# Patient Record
Sex: Male | Born: 1990 | Race: Black or African American | Hispanic: No | Marital: Single | Smoking: Former smoker
Health system: Southern US, Community
[De-identification: ages and names within clinical notes are randomized; demographics above are authoritative.]

---

## 2021-04-05 ENCOUNTER — Emergency Department (HOSPITAL_BASED_OUTPATIENT_CLINIC_OR_DEPARTMENT_OTHER)
Admission: EM | Admit: 2021-04-05 | Discharge: 2021-04-05 | Disposition: A | Discharge status: Home / Self Care | Payer: No Typology Code available for payment source | Attending: Emergency Medicine | Admitting: Emergency Medicine

## 2021-04-05 ENCOUNTER — Other Ambulatory Visit: Payer: Self-pay

## 2021-04-05 ENCOUNTER — Other Ambulatory Visit (HOSPITAL_BASED_OUTPATIENT_CLINIC_OR_DEPARTMENT_OTHER): Payer: Self-pay

## 2021-04-05 ENCOUNTER — Emergency Department (HOSPITAL_BASED_OUTPATIENT_CLINIC_OR_DEPARTMENT_OTHER): Payer: No Typology Code available for payment source

## 2021-04-05 ENCOUNTER — Encounter (HOSPITAL_BASED_OUTPATIENT_CLINIC_OR_DEPARTMENT_OTHER): Payer: Self-pay | Admitting: Emergency Medicine

## 2021-04-05 DIAGNOSIS — Y92410 Unspecified street and highway as the place of occurrence of the external cause: Secondary | ICD-10-CM | POA: Diagnosis not present

## 2021-04-05 DIAGNOSIS — M545 Low back pain, unspecified: Secondary | ICD-10-CM | POA: Insufficient documentation

## 2021-04-05 DIAGNOSIS — F17290 Nicotine dependence, other tobacco product, uncomplicated: Secondary | ICD-10-CM | POA: Insufficient documentation

## 2021-04-05 MED ORDER — NAPROXEN 375 MG PO TABS
375.0000 mg | ORAL_TABLET | Freq: Two times a day (BID) | ORAL | 0 refills | Status: AC
  Filled 2021-04-05: qty 14, 7d supply, fill #0

## 2021-04-05 MED ORDER — CYCLOBENZAPRINE HCL 10 MG PO TABS
10.0000 mg | ORAL_TABLET | Freq: Three times a day (TID) | ORAL | 0 refills | Status: AC
  Filled 2021-04-05: qty 21, 7d supply, fill #0

## 2021-04-05 MED ORDER — KETOROLAC TROMETHAMINE 15 MG/ML IJ SOLN
15.0000 mg | Freq: Once | INTRAMUSCULAR | Status: AC
  Administered 2021-04-05: 15 mg via INTRAMUSCULAR
  Filled 2021-04-05: qty 1

## 2021-04-05 NOTE — Discharge Instructions (Signed)
I have prescribed muscle relaxers for your pain, please do not drink or drive while taking this medications as they can make you drowsy.    In addition, I have prescribed anti-inflammatories to help with your pain, please take these as directed with food.  If you experience any bowel or bladder incontinence, fever, worsening in your symptoms please return to the ED.

## 2021-04-05 NOTE — ED Notes (Signed)
Patient transported to X-ray 
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
## 2021-04-13 ENCOUNTER — Other Ambulatory Visit (HOSPITAL_BASED_OUTPATIENT_CLINIC_OR_DEPARTMENT_OTHER): Payer: Self-pay

## 2021-04-18 ENCOUNTER — Encounter (HOSPITAL_BASED_OUTPATIENT_CLINIC_OR_DEPARTMENT_OTHER): Payer: Self-pay

## 2021-04-18 ENCOUNTER — Emergency Department (HOSPITAL_BASED_OUTPATIENT_CLINIC_OR_DEPARTMENT_OTHER)
Admission: EM | Admit: 2021-04-18 | Discharge: 2021-04-18 | Disposition: A | Discharge status: Home / Self Care | Payer: Self-pay | Attending: Emergency Medicine | Admitting: Emergency Medicine

## 2021-04-18 ENCOUNTER — Emergency Department (HOSPITAL_BASED_OUTPATIENT_CLINIC_OR_DEPARTMENT_OTHER): Payer: Self-pay

## 2021-04-18 ENCOUNTER — Other Ambulatory Visit: Payer: Self-pay

## 2021-04-18 DIAGNOSIS — N50811 Right testicular pain: Secondary | ICD-10-CM

## 2021-04-18 DIAGNOSIS — Z87891 Personal history of nicotine dependence: Secondary | ICD-10-CM | POA: Insufficient documentation

## 2021-04-18 LAB — URINALYSIS, ROUTINE W REFLEX MICROSCOPIC
Bilirubin Urine: NEGATIVE
Glucose, UA: NEGATIVE mg/dL
Hgb urine dipstick: NEGATIVE
Ketones, ur: NEGATIVE mg/dL
Leukocytes,Ua: NEGATIVE
Nitrite: NEGATIVE
Protein, ur: NEGATIVE mg/dL
Specific Gravity, Urine: 1.025 (ref 1.005–1.030)
pH: 6.5 (ref 5.0–8.0)

## 2021-04-18 NOTE — ED Triage Notes (Signed)
Patient reports pain to right testicle. Reports pain started this morning

## 2021-04-18 NOTE — Discharge Instructions (Signed)
The ultrasound of your testicle was normal and showed no signs of twisting/lack of blood flow or infection/inflammation.  Wear compressive/tight underwear to provide elevation and apply ice to groin. Take ibuprofen 800mg  every 8 hours for 3-4 days.  Follow-up with [facility] if your symptoms do not improve.  Return to ER if symptoms suddenly worsen.

## 2021-04-18 NOTE — ED Provider Notes (Signed)
[facility] EMERGENCY DEPARTMENT Provider Note   CSN: [id number] Arrival date & time: 04/18/21  1101     History Chief Complaint  Patient presents with  . Testicle Pain    Garrett Davis is a 30 y.o. male.  30 year old male who presents with right testicular pain.  This morning while patient was in bed, he began having right testicular pain that is now severe.  He denies any preceding trauma or injury to his groin.  No associated urinary symptoms, penile discharge, abdominal pain, fevers, vomiting, or other complaints.  He has never had this before.  The history is provided by the patient.  Testicle Pain       History reviewed. No pertinent past medical history.  There are no problems to display for this patient.   History reviewed. No pertinent surgical history.     No family history on file.  Social History   Tobacco Use  . Smoking status: Former Smoker    Types: Cigars  . Smokeless tobacco: Never Used  Vaping Use  . Vaping Use: Never used  Substance Use Topics  . Alcohol use: Yes    Comment: socially  . Drug use: Yes    Types: Marijuana    Home Medications Prior to Admission medications   Not on File    Allergies    Patient has no known allergies.  Review of Systems   Review of Systems  Genitourinary: Positive for testicular pain.   All other systems reviewed and are negative except that which was mentioned in HPI  Physical Exam Updated Vital Signs BP 136/81 (BP Location: Right Arm)   Pulse 82   Temp 98.4 F (36.9 C) (Oral)   Resp 16   Ht 6\' 4"  (1.93 m)   Wt 133.4 kg   SpO2 100%   BMI 35.79 kg/m   Physical Exam Vitals and nursing note reviewed. Exam conducted with a chaperone present.  Constitutional:      General: He is not in acute distress.    Appearance: He is well-developed.  HENT:     Head: Normocephalic and atraumatic.  Eyes:     Conjunctiva/sclera: Conjunctivae normal.  Pulmonary:     Effort: Pulmonary effort  is normal.  Abdominal:     General: Abdomen is flat. There is no distension.     Palpations: Abdomen is soft.  Genitourinary:    Testes:        Right: Tenderness present.     Comments: Inverted penis w/ no discharge at urethral meatus or skin lesions; R testicle high-riding and tender to palpation without erythema, swelling, or skin changes Musculoskeletal:     Cervical back: Neck supple.  Skin:    General: Skin is warm and dry.  Neurological:     Mental Status: He is alert and oriented to person, place, and time.  Psychiatric:        Judgment: Judgment normal.     ED Results / Procedures / Treatments   Labs (all labs ordered are listed, but only abnormal results are displayed) Labs Reviewed  URINALYSIS, ROUTINE W REFLEX MICROSCOPIC    EKG None  Radiology SCROTUM W/DOPPLER  Result Date: 04/18/2021 CLINICAL DATA:  Right scrotal pain. EXAM: SCROTAL ULTRASOUND DOPPLER ULTRASOUND OF THE TESTICLES TECHNIQUE: Complete ultrasound examination of the testicles, epididymis, and other scrotal structures was performed. Color and spectral Doppler ultrasound were also utilized to evaluate blood flow to the testicles. COMPARISON:  None. FINDINGS: Right testicle Measurements: 5.3 x 2.7 x  3.2 cm. No mass or microlithiasis visualized. Left testicle Measurements: 4.7 x 2.8 x 3.6 cm. No mass or microlithiasis visualized. Right epididymis:  Normal in size and appearance. Left epididymis:  Normal in size and appearance. Hydrocele:  None visualized. Varicocele:  None visualized. Pulsed Doppler interrogation of both testes demonstrates normal low resistance arterial and venous waveforms bilaterally. IMPRESSION: 1. Normal testicular ultrasound. Electronically Signed   By: Obie Dredge M.D.   On: 04/18/2021 12:12    Procedures Procedures   Medications Ordered in ED Medications - No data to display  ED Course  I have reviewed the triage vital signs and the nursing notes.  Pertinent labs &  imaging results that were available during my care of the patient were reviewed by me and considered in my medical decision making (see chart for details).    MDM Rules/Calculators/A&P                          DDx includes testicular torsion, epididymitis/orchitis, or hydrocele. UA normal. [place] normal w/ normal blood flow and no signs of inflammation/infection.  Recommended supportive measures including NSAIDs, elevation, ice, and rest.  Provided with urology follow-up information in the event that symptoms do not improve with these measures.  Reviewed return precautions and patient voiced understanding. Final Clinical Impression(s) / ED Diagnoses Final diagnoses:  Pain in right testicle    Rx / DC Orders ED Discharge Orders    None       Tiaria Biby, Ambrose [place], MD 04/18/21 1259

## 2021-04-18 NOTE — ED Notes (Signed)
Returned from imaging

## 2021-04-18 NOTE — ED Notes (Signed)
Pt states unable to provide urine sample at this time as he urinated pta. Specimen cup provided

## 2022-02-06 IMAGING — US US SCROTUM W/ DOPPLER COMPLETE
1 series · 14 of 25 positions shown · non-contrast
Comparison: None.

CLINICAL DATA: Right scrotal pain.

EXAM:
SCROTAL ULTRASOUND
DOPPLER ULTRASOUND OF THE TESTICLES
TECHNIQUE: Complete ultrasound examination of the testicles, epididymis, and
other scrotal structures was performed. Color and spectral Doppler
ultrasound were also utilized to evaluate blood flow to the
testicles.

[Series 1: us scrotum w/ doppler complete · 14 of 27 slices shown]
[im 1/27]
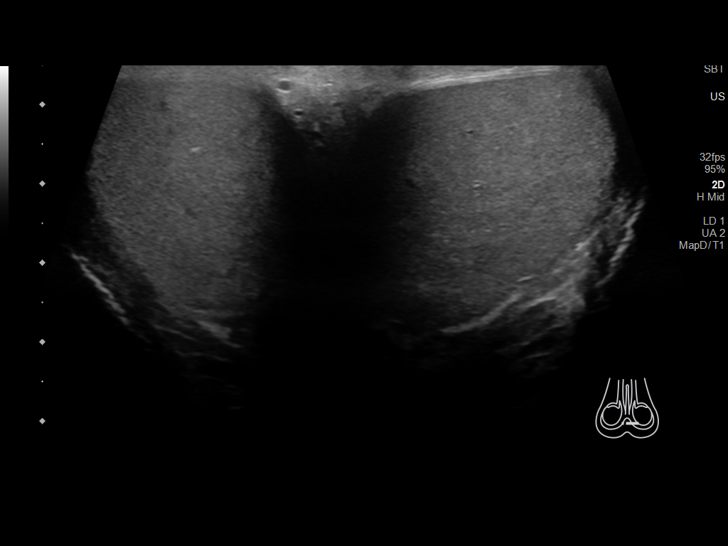
[im 3/27]
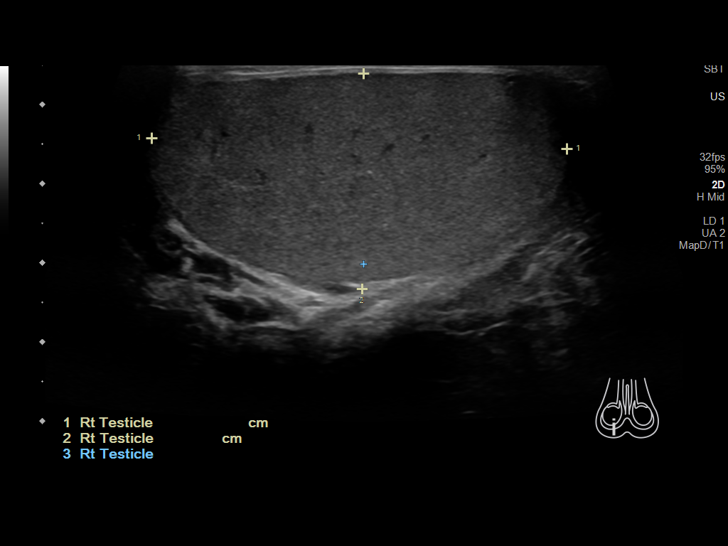
[im 5/27]
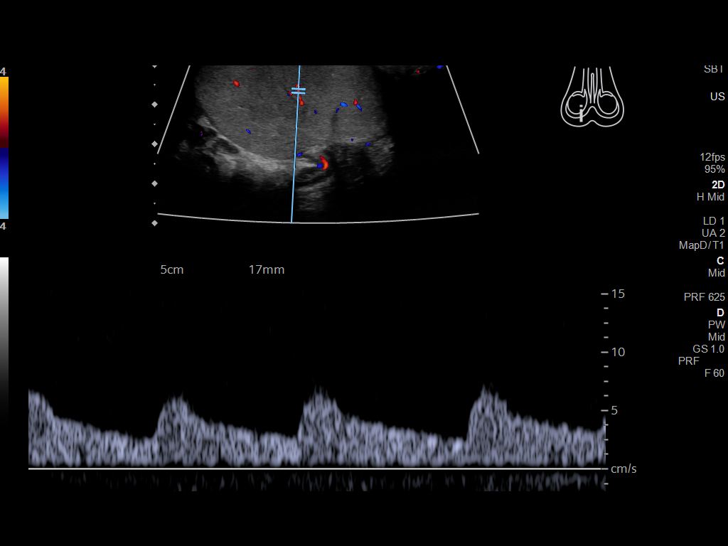
[im 7/27]
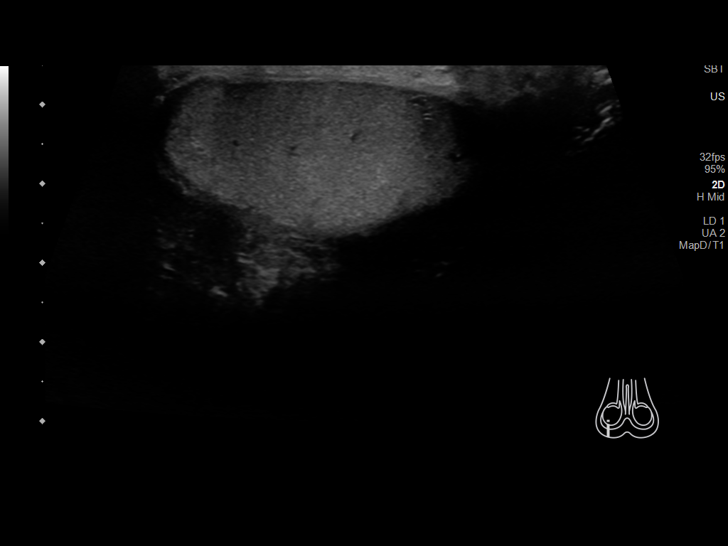
[im 9/27]
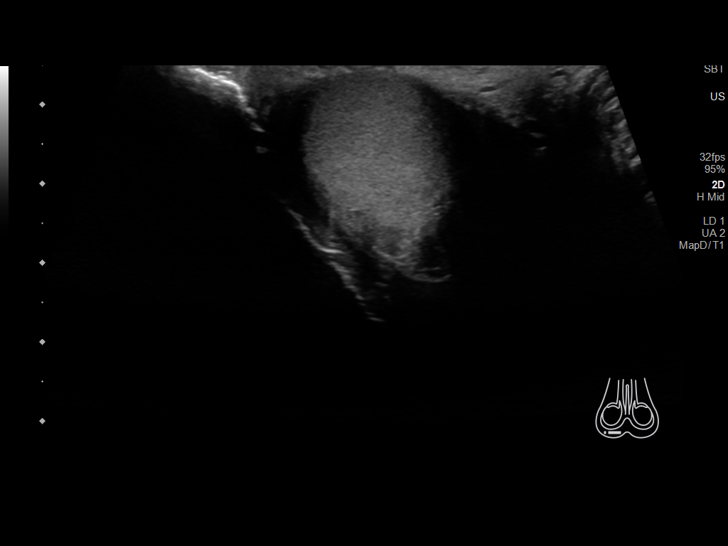
[im 10/27]
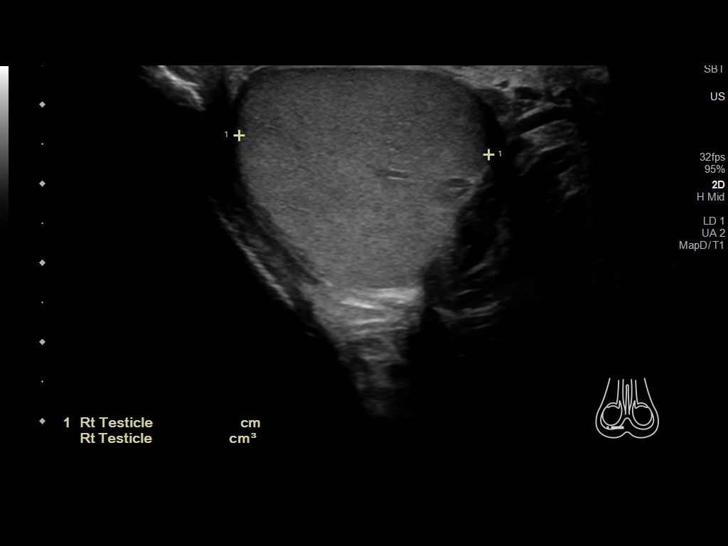
[im 12/27]
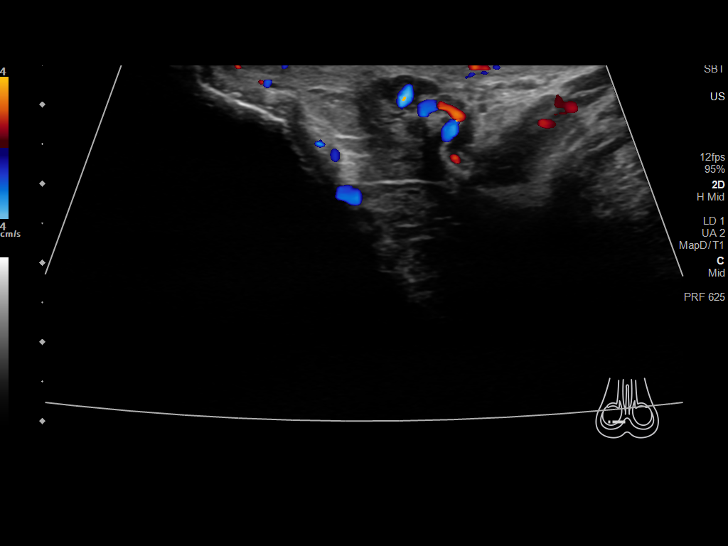
[im 15/27]
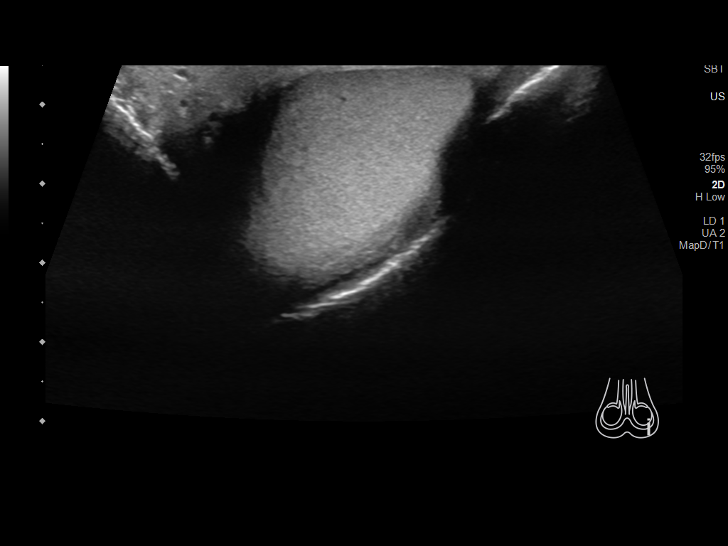
[im 17/27]
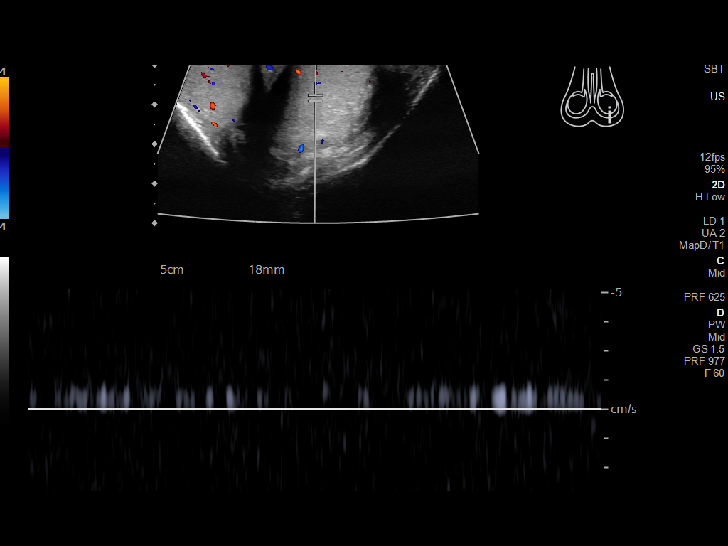
[im 18/27]
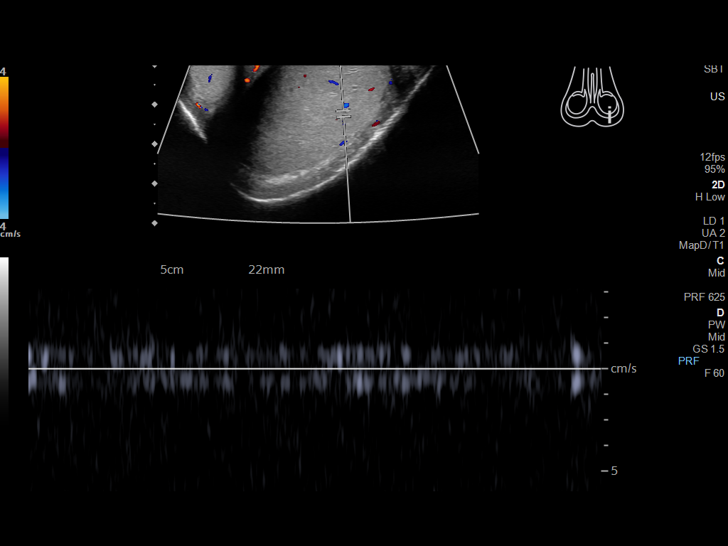
[im 20/27]
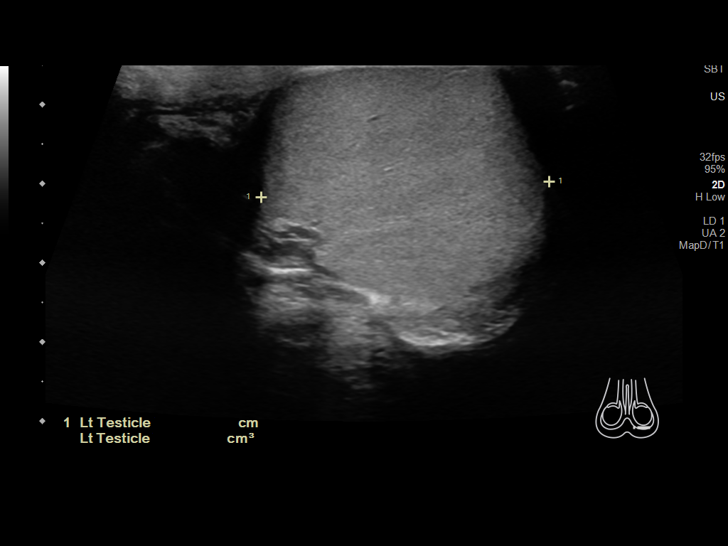
[im 22/27]
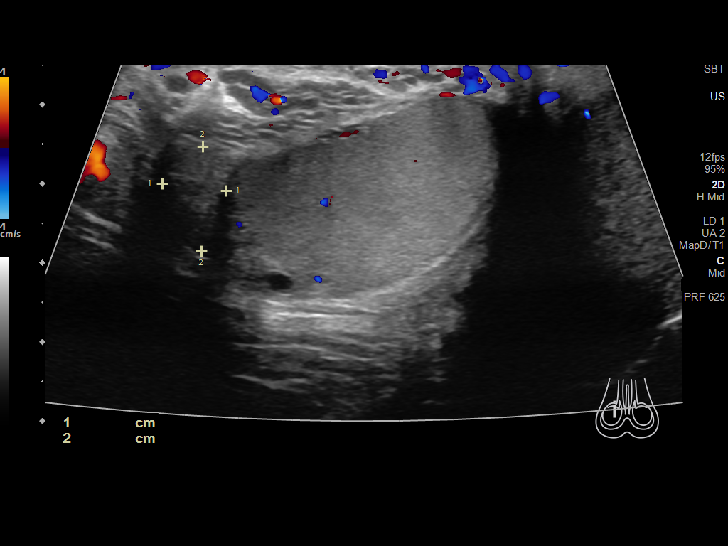
[im 24/27]
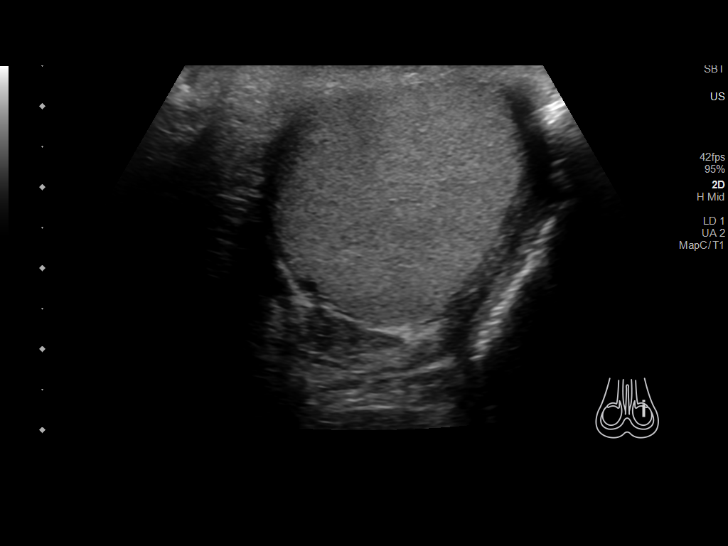
[im 27/27]
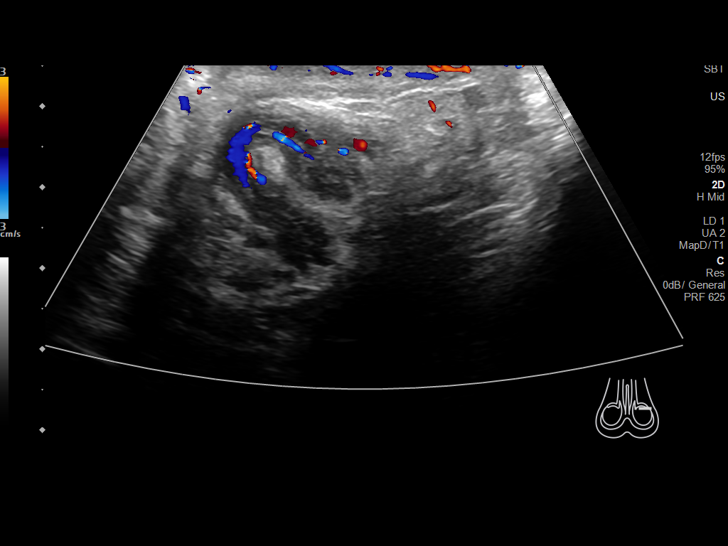

[14 of 25 positions shown; findings below may reference images not displayed]

FINDINGS: Right testicle

Measurements: 5.3 x 2.7 x 3.2 cm. No mass or microlithiasis
visualized.

Left testicle

Measurements: 4.7 x 2.8 x 3.6 cm. No mass or microlithiasis
visualized.

Right epididymis:  Normal in size and appearance.

Left epididymis:  Normal in size and appearance.

Hydrocele:  None visualized.

Varicocele:  None visualized.

Pulsed Doppler interrogation of both testes demonstrates normal low
resistance arterial and venous waveforms bilaterally.
IMPRESSION: 1. Normal testicular ultrasound.

## 2024-09-29 ENCOUNTER — Other Ambulatory Visit (HOSPITAL_COMMUNITY): Payer: Self-pay
